# Patient Record
Sex: Female | Born: 1975 | Hispanic: Yes | Marital: Married | Smoking: Never smoker
Health system: Southern US, Community
[De-identification: ages and names within clinical notes are randomized; demographics above are authoritative.]

## PROBLEM LIST (undated history)

## (undated) DIAGNOSIS — K760 Fatty (change of) liver, not elsewhere classified: Secondary | ICD-10-CM

## (undated) DIAGNOSIS — E785 Hyperlipidemia, unspecified: Secondary | ICD-10-CM

## (undated) DIAGNOSIS — E119 Type 2 diabetes mellitus without complications: Secondary | ICD-10-CM

## (undated) DIAGNOSIS — Z87440 Personal history of urinary (tract) infections: Secondary | ICD-10-CM

## (undated) DIAGNOSIS — G43909 Migraine, unspecified, not intractable, without status migrainosus: Secondary | ICD-10-CM

## (undated) HISTORY — DX: Fatty (change of) liver, not elsewhere classified: K76.0

## (undated) HISTORY — DX: Type 2 diabetes mellitus without complications: E11.9

## (undated) HISTORY — DX: Personal history of urinary (tract) infections: Z87.440

## (undated) HISTORY — PX: DILATION AND CURETTAGE OF UTERUS: SHX78

## (undated) HISTORY — DX: Migraine, unspecified, not intractable, without status migrainosus: G43.909

## (undated) HISTORY — DX: Hyperlipidemia, unspecified: E78.5

---

## 2007-12-14 LAB — HM PAP SMEAR

## 2009-02-23 ENCOUNTER — Emergency Department: Payer: Self-pay | Admitting: Emergency Medicine

## 2009-02-25 ENCOUNTER — Other Ambulatory Visit: Payer: Self-pay | Admitting: Family Medicine

## 2009-12-11 LAB — HM PAP SMEAR

## 2010-10-22 IMAGING — US US OB < 14 WEEKS - US OB TV
1 series · 17 of 28 positions shown · non-contrast
Comparison: none

REASON FOR EXAM: lower abd pressure with some spotting
COMMENTS:

[Series 1: us ob < 14 weeks - us ob tv · 17 of 56 slices shown]
[im 1/56]
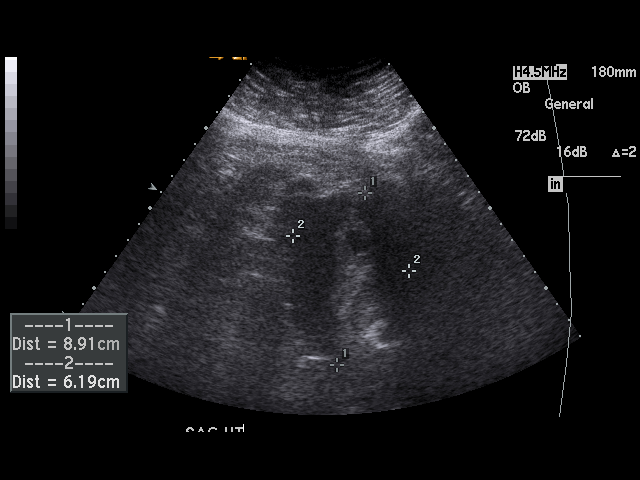
[im 5/56]
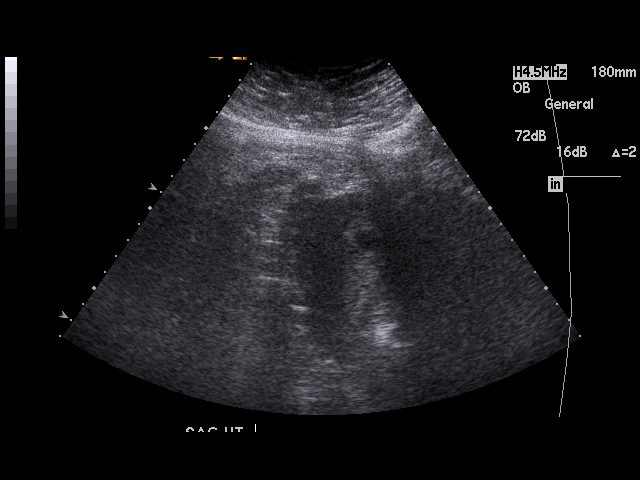
[im 9/56]
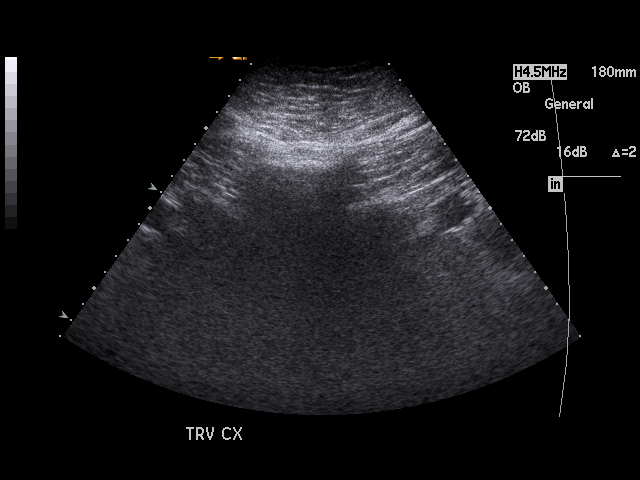
[im 11/56]
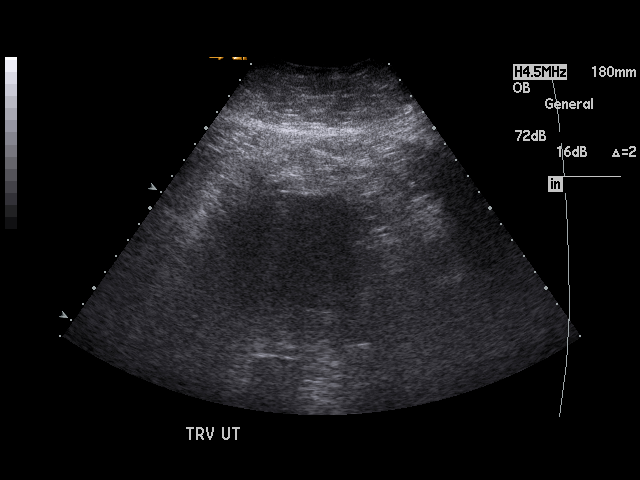
[im 15/56]
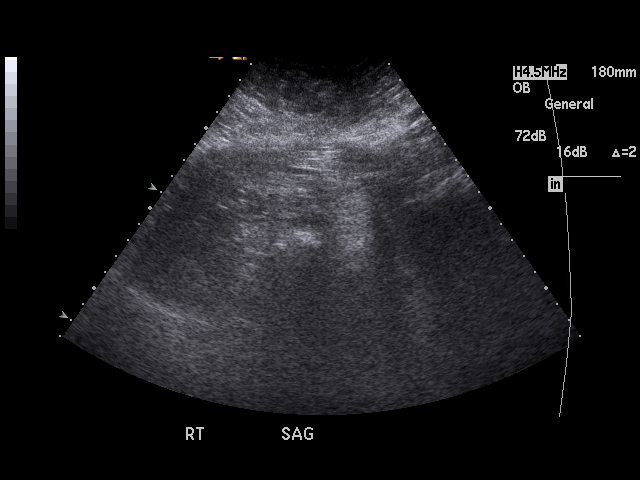
[im 19/56]
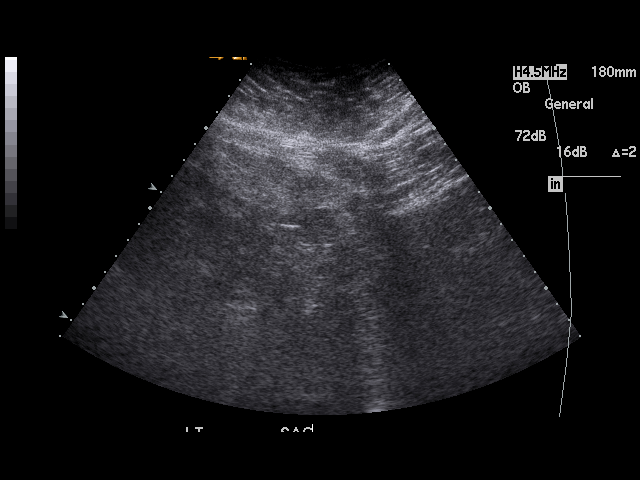
[im 21/56]
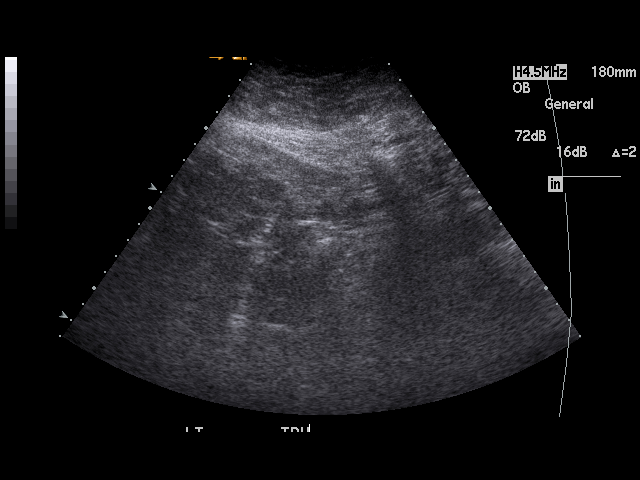
[im 25/56]
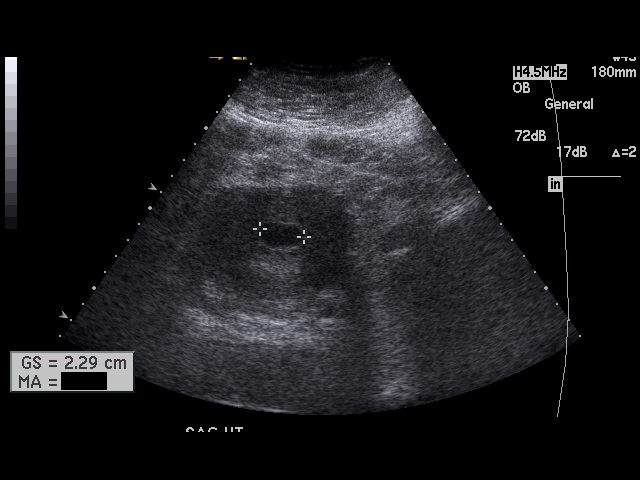
[im 29/56]
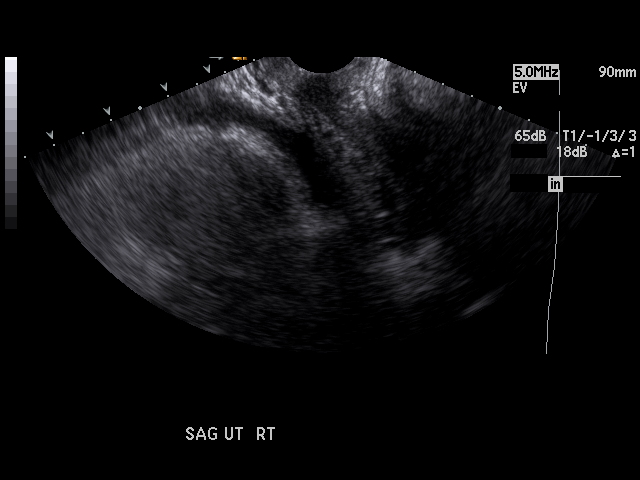
[im 31/56]
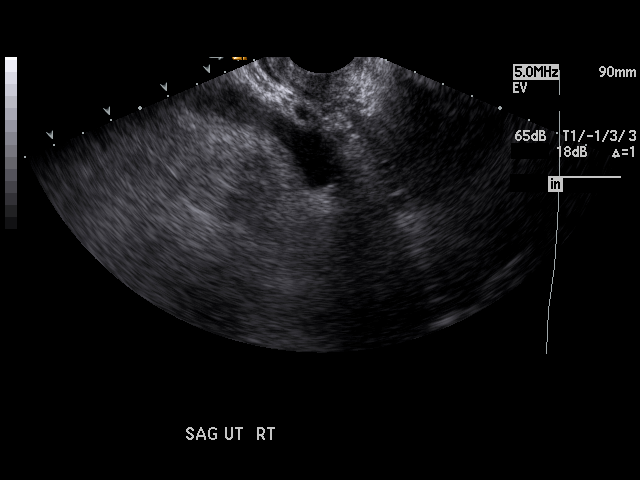
[im 35/56]
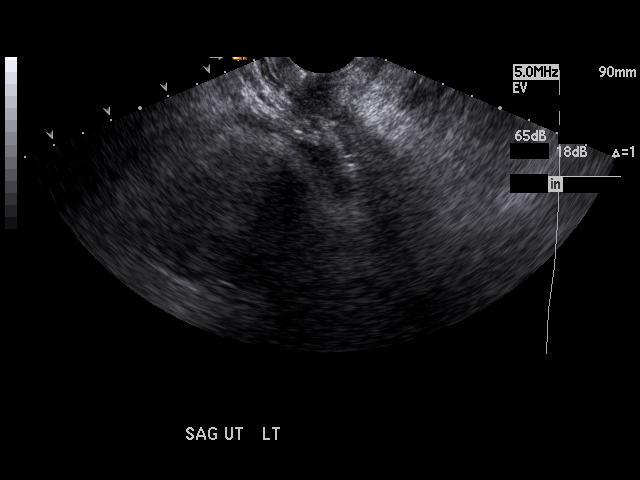
[im 37/56]
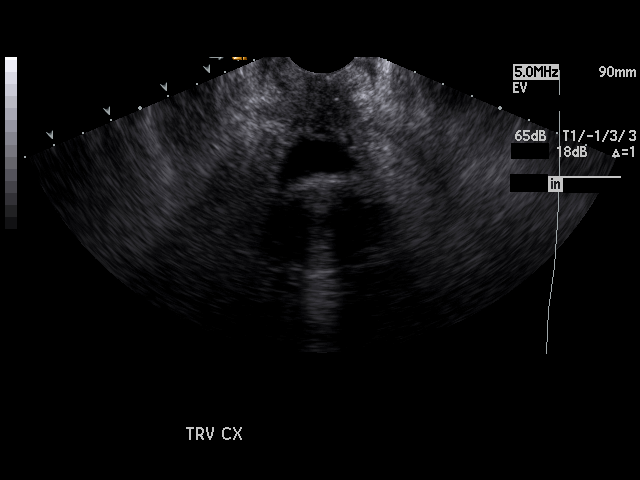
[im 41/56]
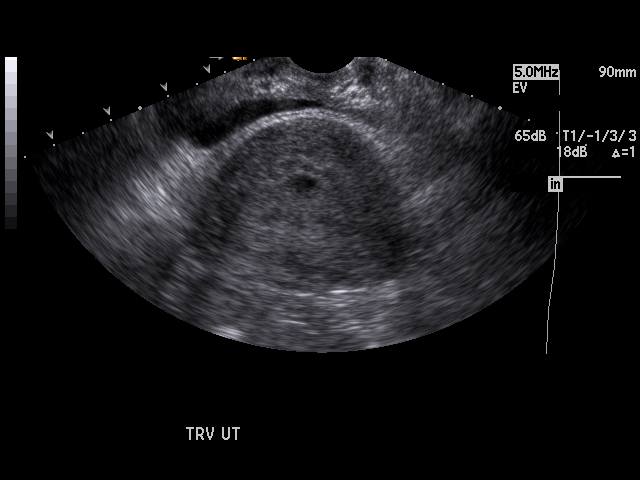
[im 45/56]
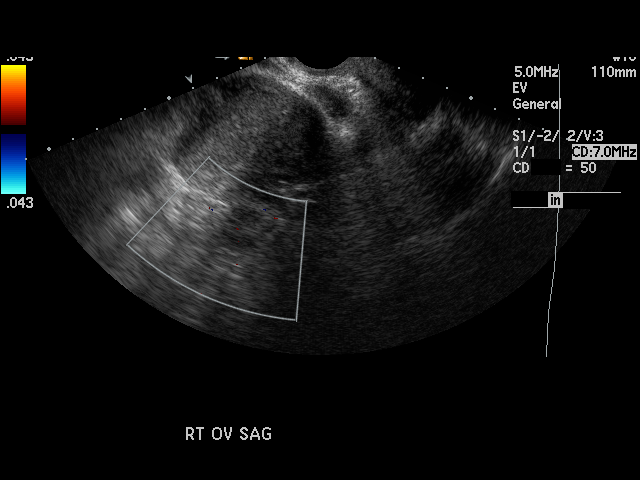
[im 47/56]
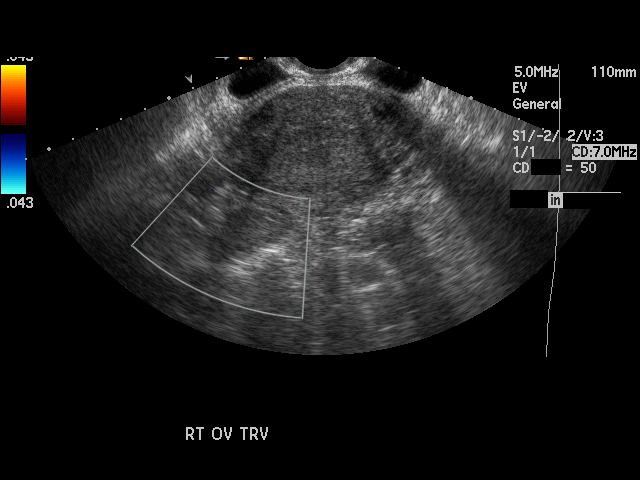
[im 51/56]
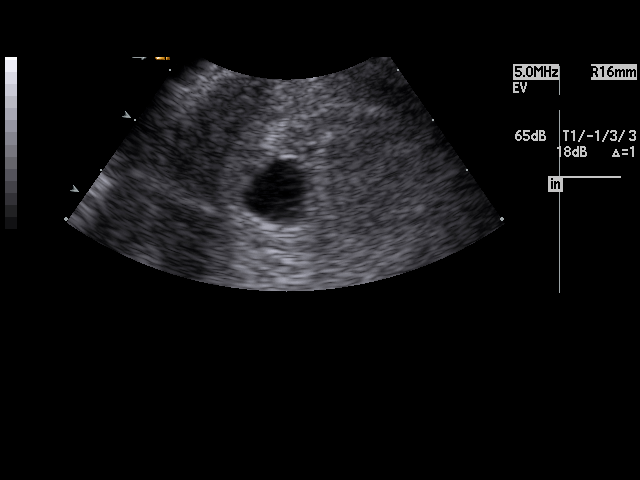
[im 56/56]
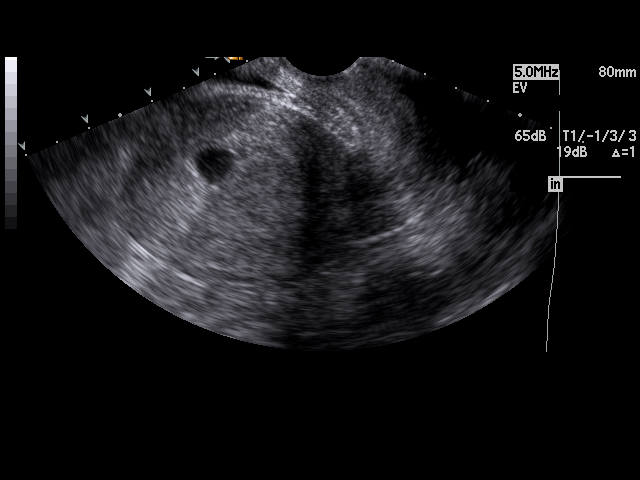

[17 of 28 positions shown; findings below may reference images not displayed]

PROCEDURE:     US  - US OB LESS THAN 14 WEEKS/W TRANS  - February 23, 2009 [DATE]

RESULT:     An intrauterine sac is present measuring 2.3 cm. No fetal pole
is noted. Although an early intrauterine pregnancy could present in this
fashion, given the size of the gestational sac (1.4 cm), a fetal pole should
be seen. This suggests the possibility of anovulatory pregnancy. Ectopic
pregnancy cannot be excluded. Follow-up Beta HCG's and repeat ultrasound
should be considered.
IMPRESSION: Empty gestational sac. Please see differential diagnosis and
discussion above.

## 2013-07-05 LAB — HM PAP SMEAR

## 2016-12-13 LAB — HM PAP SMEAR

## 2019-07-10 LAB — HM PAP SMEAR

## 2020-08-20 ENCOUNTER — Telehealth: Payer: Self-pay | Admitting: Family Medicine

## 2020-08-20 NOTE — Telephone Encounter (Signed)
Patient called to schedule an appointment for an implant removal. Also wanted to know the price. No income info available on her profile. Let her know last year's full price of $150 (per Doy Mince) and to bring ID, INS, and proof of income to her appointment.

## 2020-08-24 NOTE — Telephone Encounter (Signed)
Call to client who states desires removal / reinsertion of Nexplanon. Client states PCP is at [facility], but unable to afford re-insertion / removal at that faciltiy. Client reports Nexplanon inserted at [facility] in 2017, unsure of last physical date and thinks had PAP smear one year ago at [facility]. Client requesting appt after 2 pm and appt scheduled for 09/01/2020 with arrival time of 2:30 pm. Jossie Ng, RN

## 2020-09-01 ENCOUNTER — Other Ambulatory Visit: Payer: Self-pay

## 2020-09-01 ENCOUNTER — Encounter: Payer: Self-pay | Admitting: Family Medicine

## 2020-09-01 ENCOUNTER — Ambulatory Visit (LOCAL_COMMUNITY_HEALTH_CENTER): Payer: Self-pay | Admitting: Family Medicine

## 2020-09-01 VITALS — BP 124/79 | HR 82 | Temp 97.7°F | Ht 63.0 in | Wt 211.8 lb

## 2020-09-01 DIAGNOSIS — Z3046 Encounter for surveillance of implantable subdermal contraceptive: Secondary | ICD-10-CM

## 2020-09-01 DIAGNOSIS — Z3009 Encounter for other general counseling and advice on contraception: Secondary | ICD-10-CM

## 2020-09-01 DIAGNOSIS — Z0000 Encounter for general adult medical examination without abnormal findings: Secondary | ICD-10-CM

## 2020-09-01 MED ORDER — ETONOGESTREL 68 MG ~~LOC~~ IMPL
68.0000 mg | DRUG_IMPLANT | Freq: Once | SUBCUTANEOUS | Status: AC
  Administered 2020-09-01: 68 mg via SUBCUTANEOUS

## 2020-09-01 NOTE — Progress Notes (Signed)
[facility] DEPARTMENT [facility] [address] Main Number: [phone]    Family Planning Visit- Initial Visit  Subjective:  Tracy Mayer is a 45 y.o.  [id number]   being seen today for an initial annual visit and to discuss contraceptive options.  The patient is currently using Nexplanon for pregnancy prevention. Patient reports she does not want a pregnancy in the next year.  Patient has the following medical conditions does not have a problem list on file.  Chief Complaint  Patient presents with  . Annual Exam  . Contraception    Removal / re-insertion of Nexplanon    Patient reports here for physical, nexplanon removal and reinsertion   Patient denies and concerns.     Body mass index is 37.52 kg/m. - Patient is eligible for diabetes screening based on BMI and age >33?  yes HA1C ordered? No missed by this provider, will call to offer patient testing.    Patient reports 1  partner/s in last year. Desires STI screening?  Yes HIV and RPR only   Has patient been screened once for HCV in the past?  No  No results found for: HCVAB  Does the patient have current drug use (including MJ), have a partner with drug use, and/or has been incarcerated since last result? No  If yes-- Screen for HCV through [facility]   Does the patient meet criteria for HBV testing? No  Criteria:  -Household, sexual or needle sharing contact with HBV -History of drug use -HIV positive -Those with known Hep C   Health Maintenance Due  Topic Date Due  . Hepatitis C Screening  Never done  . COVID-19 Vaccine (1) Never done  . HIV Screening  Never done  . TETANUS/TDAP  Never done  . PAP SMEAR-Modifier  Never done    Review of Systems  Constitutional: Negative for chills, fever, malaise/fatigue and weight loss.  HENT: Negative for congestion, hearing loss and sore throat.   Eyes: Negative for blurred vision, double vision and photophobia.   Respiratory: Negative for shortness of breath.   Cardiovascular: Negative for chest pain.  Gastrointestinal: Negative for abdominal pain, blood in stool, constipation, diarrhea, heartburn, nausea and vomiting.  Genitourinary: Negative for dysuria and frequency.  Musculoskeletal: Negative for back pain, joint pain and neck pain.  Skin: Negative for itching and rash.  Neurological: Negative for dizziness, weakness and headaches.  Endo/Heme/Allergies: Does not bruise/bleed easily.  Psychiatric/Behavioral: Negative for depression, substance abuse and suicidal ideas.    The following portions of the patient's history were reviewed and updated as appropriate: allergies, current medications, past family history, past medical history, past social history, past surgical history and problem list. Problem list updated.   See flowsheet for other program required questions.  Objective:   Vitals:   09/01/20 1442  BP: 124/79  Pulse: 82  Temp: 97.7 F (36.5 C)  Weight: 211 lb 12.8 oz (96.1 kg)  Height: 5\' 3"  (1.6 m)    Physical Exam Vitals and nursing note reviewed.  Constitutional:      Appearance: Normal appearance.  HENT:     Head: Normocephalic and atraumatic.     Mouth/Throat:     Mouth: Mucous membranes are moist.     Pharynx: No oropharyngeal exudate or posterior oropharyngeal erythema.  Eyes:     General: No scleral icterus. Neck:     Thyroid: No thyroid mass, thyromegaly or thyroid tenderness.  Cardiovascular:     Rate  and Rhythm: Normal rate.     Pulses: Normal pulses.  Pulmonary:     Effort: Pulmonary effort is normal.  Abdominal:     General: Abdomen is flat. Bowel sounds are normal.     Palpations: Abdomen is soft.  Genitourinary:    Comments: Deferred  Musculoskeletal:        General: Normal range of motion.     Cervical back: Normal range of motion.  Skin:    General: Skin is warm and dry.  Neurological:     General: No focal deficit present.     Mental  Status: She is alert.       Assessment and Plan:  [name] is a 46 y.o. female presenting to the [facility] Department for an initial annual wellness/contraceptive visit  .   1. Routine general medical examination at a health care facility - last pap was done 07/2019 Declined CBE today   - HIV [place] [facility] - Syphilis Serology, [place] [facility]  2. Family planning counseling Contraception counseling: Reviewed all forms of birth control options in the tiered based approach. available including abstinence; over the counter/barrier methods; hormonal contraceptive medication including pill, patch, ring, injection,contraceptive implant, ECP; hormonal and nonhormonal IUDs; permanent sterilization options including vasectomy and the various tubal sterilization modalities. Risks, benefits, and typical effectiveness rates were reviewed.  Questions were answered.  Written information was also given to the patient to review.  Patient desires Nexplanon , this was prescribed for patient. She will follow up as needed otherwise 4 years  for surveillance.  She was told to call with any further questions, or with any concerns about this method of contraception.  Emphasized use of condoms 100% of the time for STI prevention.  Patient was not offered ECP based on pt current BCM and LMP    3. Encounter for removal and reinsertion of Nexplanon - etonogestrel (NEXPLANON) implant 68 mg  Nexplanon Removal and Insertion  Patient identified, informed consent performed, consent signed.   Patient does understand that irregular bleeding is a very common side effect of this medication. She was advised to have backup contraception for one week after replacement of the implant. Patient deemed to meet WHO criteria for being reasonably certain she is not pregnant.  Appropriate time out taken. Nexplanon site identified. Area prepped in usual sterile fashon. 3 ml of 1% lidocaine with epinephrine was  used to anesthetize the area at the distal end of the implant. A small stab incision was made right beside the implant on the distal portion. The Nexplanon rod was grasped using hemostats and removed without difficulty. There was minimal blood loss. There were no complications.   Confirmed correct location of insertion site. The insertion site was identified 8-10 cm (3-4 inches) from the medial epicondyle of the humerus and 3-5 cm (1.25-2 inches) posterior to (below) the sulcus (groove) between the biceps and triceps muscles of the patient's left arm. New Nexplanon removed from packaging, Device confirmed in needle, then inserted full length of needle and withdrawn per handbook instructions. Nexplanon was able to palpated in the patient's Left  arm; patient palpated the insert herself.  There was minimal blood loss. Patient insertion site covered with guaze and a pressure bandage to reduce any bruising. The patient tolerated the procedure well and was given post procedure instructions.    Counseled patient to take OTC analgesic starting as soon as lidocaine starts to wear off and take regularly for at least 48 hr  to decrease discomfort.  Specifically to take with food or milk to decrease stomach upset and for IB 600 mg (3 tablets) every 6 hrs; IB 800 mg (4 tablets) every 8 hrs; or Aleve 2 tablets every 12 hrs.  Juliene Pina  used for Spanish interpretation.      Return in about 1 year (around 09/01/2021) for annual well woman exam.  No future appointments.  Wendi Snipes, FNP

## 2020-09-01 NOTE — Progress Notes (Addendum)
Father died unexpectedly 15 days ago from suspected MI. ROI for 07/2019 PAP at [facility] faxed with fax confirmation received. Jossie Ng, RN Requested PAP reports received via fax 09/02/2020 and sent for scanning. Last PAP 07/10/2019 NIL. PAPs labeled and placed in Sadie Haber PA-C box in [facility] provider workroom for review prior to scanning. Jossie Ng, RN

## 2020-09-07 LAB — HM HIV SCREENING LAB: HM HIV Screening: NEGATIVE

## 2020-09-08 ENCOUNTER — Encounter: Payer: Self-pay | Admitting: Physician Assistant
